# Patient Record
Sex: Female | Born: 1965 | Race: Black or African American | Hispanic: No | Marital: Single | Smoking: Never smoker
Health system: Southern US, Community
[De-identification: ages and names within clinical notes are randomized; demographics above are authoritative.]

## PROBLEM LIST (undated history)

## (undated) DIAGNOSIS — E611 Iron deficiency: Secondary | ICD-10-CM

## (undated) DIAGNOSIS — E119 Type 2 diabetes mellitus without complications: Secondary | ICD-10-CM

## (undated) DIAGNOSIS — R319 Hematuria, unspecified: Secondary | ICD-10-CM

## (undated) DIAGNOSIS — I10 Essential (primary) hypertension: Secondary | ICD-10-CM

## (undated) DIAGNOSIS — E785 Hyperlipidemia, unspecified: Secondary | ICD-10-CM

## (undated) HISTORY — DX: Hematuria, unspecified: R31.9

## (undated) HISTORY — PX: REDUCTION MAMMAPLASTY: SUR839

## (undated) HISTORY — DX: Essential (primary) hypertension: I10

## (undated) HISTORY — DX: Iron deficiency: E61.1

## (undated) HISTORY — DX: Hyperlipidemia, unspecified: E78.5

## (undated) HISTORY — DX: Type 2 diabetes mellitus without complications: E11.9

---

## 1998-03-18 ENCOUNTER — Emergency Department (HOSPITAL_COMMUNITY): Admission: EM | Admit: 1998-03-18 | Discharge: 1998-03-18 | Payer: Self-pay

## 2000-12-15 ENCOUNTER — Ambulatory Visit (HOSPITAL_COMMUNITY): Admission: RE | Admit: 2000-12-15 | Discharge: 2000-12-15 | Payer: Self-pay | Admitting: Family Medicine

## 2000-12-15 ENCOUNTER — Encounter: Payer: Self-pay | Admitting: Family Medicine

## 2001-06-30 ENCOUNTER — Other Ambulatory Visit: Admission: RE | Admit: 2001-06-30 | Discharge: 2001-06-30 | Payer: Self-pay | Admitting: Obstetrics and Gynecology

## 2002-03-15 ENCOUNTER — Ambulatory Visit (HOSPITAL_BASED_OUTPATIENT_CLINIC_OR_DEPARTMENT_OTHER): Admission: RE | Admit: 2002-03-15 | Discharge: 2002-03-16 | Payer: Self-pay | Admitting: Specialist

## 2002-03-15 ENCOUNTER — Encounter (INDEPENDENT_AMBULATORY_CARE_PROVIDER_SITE_OTHER): Payer: Self-pay | Admitting: Specialist

## 2002-05-21 ENCOUNTER — Ambulatory Visit: Admission: RE | Admit: 2002-05-21 | Discharge: 2002-05-21 | Payer: Self-pay | Admitting: Family Medicine

## 2005-04-26 ENCOUNTER — Encounter: Admission: RE | Admit: 2005-04-26 | Discharge: 2005-04-26 | Payer: Self-pay | Admitting: Obstetrics and Gynecology

## 2005-05-07 ENCOUNTER — Encounter: Admission: RE | Admit: 2005-05-07 | Discharge: 2005-05-07 | Payer: Self-pay | Admitting: Obstetrics and Gynecology

## 2006-04-29 ENCOUNTER — Encounter: Admission: RE | Admit: 2006-04-29 | Discharge: 2006-04-29 | Payer: Self-pay | Admitting: Family Medicine

## 2006-05-07 ENCOUNTER — Emergency Department (HOSPITAL_COMMUNITY): Admission: EM | Admit: 2006-05-07 | Discharge: 2006-05-07 | Payer: Self-pay | Admitting: Emergency Medicine

## 2007-02-26 ENCOUNTER — Emergency Department (HOSPITAL_COMMUNITY): Admission: EM | Admit: 2007-02-26 | Discharge: 2007-02-27 | Payer: Self-pay | Admitting: *Deleted

## 2007-05-04 ENCOUNTER — Encounter: Admission: RE | Admit: 2007-05-04 | Discharge: 2007-05-04 | Payer: Self-pay | Admitting: Family Medicine

## 2007-09-21 ENCOUNTER — Emergency Department (HOSPITAL_COMMUNITY): Admission: EM | Admit: 2007-09-21 | Discharge: 2007-09-22 | Payer: Self-pay | Admitting: Emergency Medicine

## 2007-09-22 ENCOUNTER — Ambulatory Visit: Payer: Self-pay | Admitting: Surgery

## 2007-09-22 ENCOUNTER — Ambulatory Visit (HOSPITAL_COMMUNITY): Admission: RE | Admit: 2007-09-22 | Discharge: 2007-09-22 | Payer: Self-pay | Admitting: Emergency Medicine

## 2007-09-22 ENCOUNTER — Encounter (INDEPENDENT_AMBULATORY_CARE_PROVIDER_SITE_OTHER): Payer: Self-pay | Admitting: Emergency Medicine

## 2008-05-09 ENCOUNTER — Encounter: Admission: RE | Admit: 2008-05-09 | Discharge: 2008-05-09 | Payer: Self-pay | Admitting: Family Medicine

## 2009-03-09 ENCOUNTER — Emergency Department (HOSPITAL_COMMUNITY): Admission: EM | Admit: 2009-03-09 | Discharge: 2009-03-09 | Payer: Self-pay | Admitting: Emergency Medicine

## 2009-05-10 ENCOUNTER — Encounter: Admission: RE | Admit: 2009-05-10 | Discharge: 2009-05-10 | Payer: Self-pay | Admitting: Family Medicine

## 2010-05-11 ENCOUNTER — Encounter: Admission: RE | Admit: 2010-05-11 | Discharge: 2010-05-11 | Payer: Self-pay | Admitting: Family Medicine

## 2010-09-30 ENCOUNTER — Encounter: Payer: Self-pay | Admitting: Obstetrics and Gynecology

## 2011-01-25 NOTE — Op Note (Signed)
[facility]  Patient:    Karen Buckley, FELKER Visit Number: [id number] MRN: [id number]          Service Type: DSU Location: [facility] Attending Physician:  Gustavus Messing Dictated by:   Yaakov Guthrie. Shon Hough, M.D. Admit Date:  03/15/2002 Discharge Date: 03/16/2002                             Operative Report  PREOPERATIVE DIAGNOSIS:  POSTOPERATIVE DIAGNOSIS:  PROCEDURE:  SURGEON:  Earvin Hansen L. Shon Hough, M.D.  ASSISTANT:  Alethia Berthold.  ANESTHESIA:  General.  INDICATION:  [age]-year-old lady with severe macromastia, back and shoulder pain secondary to large pendulous breasts, increased pitting edema as well as intertriginous changes in the right and left inframammary fold regions.  She has tried other medications including anti-inflammatory agents with no avail, as well as special bras.  She is a DDD-E bra and is now being prepared for bilateral reductions using the inferior pedicle technique.  DESCRIPTION OF PROCEDURE:  The patient was sat up and drawn for the inferior pedicle reduction mammoplasties, remarking the nipple-areolar complexes 20 cm from the suprasternal notch, then under general anesthesia, intubation was done without problems.  Prep was done to the chest and breast areas in routine fashion using Betadine soaping solution and walled off with sterile towels and draped so as to make a sterile field.  The patient also demonstrated increased accessory breast tissue, located predominantly in the upper axillary regions as well as the latissimus dorsi regions and serratus anterior.  Xylocaine 0.25% with epinephrine was injected locally for vasoconstriction, a total of 150 cc per side.  The wounds were scored with #15 blades, then the skin over the inferior pedicles de-epithelialized with #20 blades and medial and lateral fatty dermal pedicles were incised down to underlying fascia.  Out laterally, more excessive breast tissue  was removed and then liposuction was then fashioned to improve and remove accessory breast tissue in the upper axillary regions, right and left latissimus dorsi and serratus anterior areas.  After proper hemostasis and after the new keyholes were debulked, the flaps were transposed and stayed with 3-0 Prolene.  Subcutaneous tissue closure was done with 3-0 Monocryl x2 layers, then running subcuticular stitches of 3-0 Monocryl and 5-0 Monocryl throughout the inverted Ts.  The wounds were drained with #10 Blake drains, fully fluted, placed in the depths of the wounds and brought out through the lateral-most portions of the incisions and secured with 3-0 Prolene.  The wounds were cleansed, half-inch Steri-Strips and soft dressings applied to areas with Xeroform, 4 x 4s, ABDs and Hypafix tape.  She withstood the procedures very well and was taken to recovery in excellent condition.  ESTIMATED BLOOD LOSS:  Less than 150 cc.  COMPLICATIONS:  None. Dictated by:   Yaakov Guthrie. Shon Hough, M.D. Attending Physician:  Gustavus Messing DD:  03/15/02 TD:  03/17/02 Job: [id number]

## 2011-04-01 ENCOUNTER — Other Ambulatory Visit: Payer: Self-pay | Admitting: Obstetrics and Gynecology

## 2011-04-01 DIAGNOSIS — Z1231 Encounter for screening mammogram for malignant neoplasm of breast: Secondary | ICD-10-CM

## 2011-05-14 ENCOUNTER — Ambulatory Visit: Payer: Self-pay

## 2011-05-15 ENCOUNTER — Ambulatory Visit
Admission: RE | Admit: 2011-05-15 | Discharge: 2011-05-15 | Disposition: A | Discharge status: Home / Self Care | Payer: BC Managed Care – PPO | Source: Ambulatory Visit | Attending: Obstetrics and Gynecology | Admitting: Obstetrics and Gynecology

## 2011-05-15 DIAGNOSIS — Z1231 Encounter for screening mammogram for malignant neoplasm of breast: Secondary | ICD-10-CM

## 2011-06-26 LAB — CBC
HCT: 31.6 — ABNORMAL LOW
MCHC: 31.7
MCV: 68.5 — ABNORMAL LOW
Platelets: 297
RDW: 16.7 — ABNORMAL HIGH

## 2011-06-26 LAB — I-STAT 8, (EC8 V) (CONVERTED LAB)
BUN: 7
Bicarbonate: 24.7 — ABNORMAL HIGH
Glucose, Bld: 146 — ABNORMAL HIGH
Hemoglobin: 11.6 — ABNORMAL LOW
Sodium: 139

## 2011-06-26 LAB — DIFFERENTIAL
Basophils Absolute: 0
Lymphs Abs: 2.4
Monocytes Absolute: 0.6

## 2011-06-26 LAB — POCT CARDIAC MARKERS
CKMB, poc: 1.5
Myoglobin, poc: 92.9

## 2011-12-23 ENCOUNTER — Ambulatory Visit
Admission: RE | Admit: 2011-12-23 | Discharge: 2011-12-23 | Disposition: A | Discharge status: Home / Self Care | Payer: BC Managed Care – PPO | Source: Ambulatory Visit | Attending: Family Medicine | Admitting: Family Medicine

## 2011-12-23 ENCOUNTER — Other Ambulatory Visit: Payer: Self-pay | Admitting: Family Medicine

## 2011-12-23 DIAGNOSIS — R609 Edema, unspecified: Secondary | ICD-10-CM

## 2012-04-08 ENCOUNTER — Other Ambulatory Visit: Payer: Self-pay | Admitting: Family Medicine

## 2012-04-08 DIAGNOSIS — Z1231 Encounter for screening mammogram for malignant neoplasm of breast: Secondary | ICD-10-CM

## 2012-05-15 ENCOUNTER — Ambulatory Visit
Admission: RE | Admit: 2012-05-15 | Discharge: 2012-05-15 | Disposition: A | Discharge status: Home / Self Care | Payer: BC Managed Care – PPO | Source: Ambulatory Visit | Attending: Family Medicine | Admitting: Family Medicine

## 2012-05-15 DIAGNOSIS — Z1231 Encounter for screening mammogram for malignant neoplasm of breast: Secondary | ICD-10-CM

## 2013-04-21 ENCOUNTER — Other Ambulatory Visit: Payer: Self-pay

## 2013-04-21 DIAGNOSIS — Z1231 Encounter for screening mammogram for malignant neoplasm of breast: Secondary | ICD-10-CM

## 2013-04-21 DIAGNOSIS — Z9889 Other specified postprocedural states: Secondary | ICD-10-CM

## 2013-05-17 ENCOUNTER — Ambulatory Visit
Admission: RE | Admit: 2013-05-17 | Discharge: 2013-05-17 | Disposition: A | Discharge status: Home / Self Care | Payer: BC Managed Care – PPO | Source: Ambulatory Visit

## 2013-05-17 DIAGNOSIS — Z1231 Encounter for screening mammogram for malignant neoplasm of breast: Secondary | ICD-10-CM

## 2013-05-17 DIAGNOSIS — Z9889 Other specified postprocedural states: Secondary | ICD-10-CM

## 2013-05-18 ENCOUNTER — Other Ambulatory Visit: Payer: Self-pay | Admitting: Family Medicine

## 2013-05-18 DIAGNOSIS — N63 Unspecified lump in unspecified breast: Secondary | ICD-10-CM

## 2013-06-24 ENCOUNTER — Ambulatory Visit
Admission: RE | Admit: 2013-06-24 | Discharge: 2013-06-24 | Disposition: A | Discharge status: Home / Self Care | Payer: BC Managed Care – PPO | Source: Ambulatory Visit | Attending: Family Medicine | Admitting: Family Medicine

## 2013-06-24 DIAGNOSIS — N63 Unspecified lump in unspecified breast: Secondary | ICD-10-CM

## 2014-01-03 ENCOUNTER — Other Ambulatory Visit: Payer: Self-pay | Admitting: Family Medicine

## 2014-01-03 DIAGNOSIS — N63 Unspecified lump in unspecified breast: Secondary | ICD-10-CM

## 2014-01-03 DIAGNOSIS — N644 Mastodynia: Secondary | ICD-10-CM

## 2014-01-10 ENCOUNTER — Encounter (INDEPENDENT_AMBULATORY_CARE_PROVIDER_SITE_OTHER): Payer: Self-pay

## 2014-01-10 ENCOUNTER — Ambulatory Visit
Admission: RE | Admit: 2014-01-10 | Discharge: 2014-01-10 | Disposition: A | Discharge status: Home / Self Care | Payer: BC Managed Care – PPO | Source: Ambulatory Visit | Attending: Family Medicine | Admitting: Family Medicine

## 2014-01-10 DIAGNOSIS — N644 Mastodynia: Secondary | ICD-10-CM

## 2014-01-10 DIAGNOSIS — N63 Unspecified lump in unspecified breast: Secondary | ICD-10-CM

## 2014-05-24 ENCOUNTER — Other Ambulatory Visit: Payer: Self-pay

## 2014-05-24 DIAGNOSIS — Z9889 Other specified postprocedural states: Secondary | ICD-10-CM

## 2014-05-24 DIAGNOSIS — Z1231 Encounter for screening mammogram for malignant neoplasm of breast: Secondary | ICD-10-CM

## 2014-06-27 ENCOUNTER — Ambulatory Visit
Admission: RE | Admit: 2014-06-27 | Discharge: 2014-06-27 | Disposition: A | Discharge status: Home / Self Care | Payer: BC Managed Care – PPO | Source: Ambulatory Visit

## 2014-06-27 ENCOUNTER — Encounter (INDEPENDENT_AMBULATORY_CARE_PROVIDER_SITE_OTHER): Payer: Self-pay

## 2014-06-27 DIAGNOSIS — Z9889 Other specified postprocedural states: Secondary | ICD-10-CM

## 2014-06-27 DIAGNOSIS — Z1231 Encounter for screening mammogram for malignant neoplasm of breast: Secondary | ICD-10-CM

## 2014-09-07 ENCOUNTER — Encounter: Payer: Self-pay | Admitting: *Deleted

## 2015-01-11 ENCOUNTER — Ambulatory Visit
Admission: RE | Admit: 2015-01-11 | Discharge: 2015-01-11 | Disposition: A | Discharge status: Home / Self Care | Payer: BC Managed Care – PPO | Source: Ambulatory Visit | Attending: Family Medicine | Admitting: Family Medicine

## 2015-01-11 ENCOUNTER — Other Ambulatory Visit: Payer: Self-pay | Admitting: Family Medicine

## 2015-01-11 DIAGNOSIS — S90121A Contusion of right lesser toe(s) without damage to nail, initial encounter: Secondary | ICD-10-CM

## 2015-05-16 ENCOUNTER — Other Ambulatory Visit: Payer: Self-pay

## 2015-05-16 DIAGNOSIS — Z1231 Encounter for screening mammogram for malignant neoplasm of breast: Secondary | ICD-10-CM

## 2015-07-05 ENCOUNTER — Ambulatory Visit: Payer: BC Managed Care – PPO

## 2015-07-19 ENCOUNTER — Ambulatory Visit: Payer: BC Managed Care – PPO

## 2015-08-01 ENCOUNTER — Ambulatory Visit: Payer: BC Managed Care – PPO

## 2015-08-08 ENCOUNTER — Ambulatory Visit
Admission: RE | Admit: 2015-08-08 | Discharge: 2015-08-08 | Disposition: A | Discharge status: Home / Self Care | Payer: BC Managed Care – PPO | Source: Ambulatory Visit

## 2015-08-08 DIAGNOSIS — Z1231 Encounter for screening mammogram for malignant neoplasm of breast: Secondary | ICD-10-CM

## 2016-04-08 ENCOUNTER — Ambulatory Visit
Admission: RE | Admit: 2016-04-08 | Discharge: 2016-04-08 | Disposition: A | Discharge status: Home / Self Care | Payer: BC Managed Care – PPO | Source: Ambulatory Visit | Attending: Family Medicine | Admitting: Family Medicine

## 2016-04-08 ENCOUNTER — Other Ambulatory Visit: Payer: Self-pay | Admitting: Family Medicine

## 2016-04-08 DIAGNOSIS — R059 Cough, unspecified: Secondary | ICD-10-CM

## 2016-04-08 DIAGNOSIS — R05 Cough: Secondary | ICD-10-CM

## 2016-07-01 ENCOUNTER — Other Ambulatory Visit: Payer: Self-pay | Admitting: Obstetrics and Gynecology

## 2016-07-01 DIAGNOSIS — Z1231 Encounter for screening mammogram for malignant neoplasm of breast: Secondary | ICD-10-CM

## 2016-07-09 ENCOUNTER — Other Ambulatory Visit: Payer: Self-pay | Admitting: Family Medicine

## 2016-07-09 DIAGNOSIS — N060 Isolated proteinuria with minor glomerular abnormality: Secondary | ICD-10-CM

## 2016-07-17 ENCOUNTER — Ambulatory Visit
Admission: RE | Admit: 2016-07-17 | Discharge: 2016-07-17 | Disposition: A | Discharge status: Home / Self Care | Payer: BC Managed Care – PPO | Source: Ambulatory Visit | Attending: Family Medicine | Admitting: Family Medicine

## 2016-07-17 DIAGNOSIS — N060 Isolated proteinuria with minor glomerular abnormality: Secondary | ICD-10-CM

## 2016-07-18 ENCOUNTER — Other Ambulatory Visit: Payer: BC Managed Care – PPO

## 2016-08-19 ENCOUNTER — Ambulatory Visit
Admission: RE | Admit: 2016-08-19 | Discharge: 2016-08-19 | Disposition: A | Discharge status: Home / Self Care | Payer: BC Managed Care – PPO | Source: Ambulatory Visit | Attending: Obstetrics and Gynecology | Admitting: Obstetrics and Gynecology

## 2016-08-19 DIAGNOSIS — Z1231 Encounter for screening mammogram for malignant neoplasm of breast: Secondary | ICD-10-CM

## 2016-08-20 ENCOUNTER — Other Ambulatory Visit: Payer: Self-pay | Admitting: Obstetrics and Gynecology

## 2016-08-20 DIAGNOSIS — R928 Other abnormal and inconclusive findings on diagnostic imaging of breast: Secondary | ICD-10-CM

## 2016-08-28 ENCOUNTER — Other Ambulatory Visit: Payer: BC Managed Care – PPO

## 2016-08-29 ENCOUNTER — Ambulatory Visit
Admission: RE | Admit: 2016-08-29 | Discharge: 2016-08-29 | Disposition: A | Discharge status: Home / Self Care | Payer: BC Managed Care – PPO | Source: Ambulatory Visit | Attending: Obstetrics and Gynecology | Admitting: Obstetrics and Gynecology

## 2016-08-29 DIAGNOSIS — R928 Other abnormal and inconclusive findings on diagnostic imaging of breast: Secondary | ICD-10-CM

## 2017-02-17 ENCOUNTER — Ambulatory Visit
Admission: RE | Admit: 2017-02-17 | Discharge: 2017-02-17 | Disposition: A | Discharge status: Home / Self Care | Payer: BC Managed Care – PPO | Source: Ambulatory Visit | Attending: Family Medicine | Admitting: Family Medicine

## 2017-02-17 ENCOUNTER — Other Ambulatory Visit: Payer: Self-pay | Admitting: Family Medicine

## 2017-02-17 DIAGNOSIS — S60221A Contusion of right hand, initial encounter: Secondary | ICD-10-CM

## 2017-04-03 ENCOUNTER — Other Ambulatory Visit: Payer: Self-pay | Admitting: Nephrology

## 2017-04-03 DIAGNOSIS — R319 Hematuria, unspecified: Secondary | ICD-10-CM

## 2017-04-07 ENCOUNTER — Other Ambulatory Visit: Payer: BC Managed Care – PPO

## 2017-04-08 ENCOUNTER — Ambulatory Visit
Admission: RE | Admit: 2017-04-08 | Discharge: 2017-04-08 | Disposition: A | Discharge status: Home / Self Care | Payer: BC Managed Care – PPO | Source: Ambulatory Visit | Attending: Nephrology | Admitting: Nephrology

## 2017-04-08 DIAGNOSIS — R319 Hematuria, unspecified: Secondary | ICD-10-CM

## 2017-08-06 ENCOUNTER — Other Ambulatory Visit: Payer: Self-pay | Admitting: Obstetrics and Gynecology

## 2017-08-06 DIAGNOSIS — Z1231 Encounter for screening mammogram for malignant neoplasm of breast: Secondary | ICD-10-CM

## 2017-09-04 ENCOUNTER — Ambulatory Visit: Payer: BC Managed Care – PPO

## 2017-09-05 ENCOUNTER — Ambulatory Visit
Admission: RE | Admit: 2017-09-05 | Discharge: 2017-09-05 | Disposition: A | Discharge status: Home / Self Care | Payer: BC Managed Care – PPO | Source: Ambulatory Visit | Attending: Obstetrics and Gynecology | Admitting: Obstetrics and Gynecology

## 2017-09-05 DIAGNOSIS — Z1231 Encounter for screening mammogram for malignant neoplasm of breast: Secondary | ICD-10-CM

## 2018-07-15 IMAGING — US US RENAL
1 series · 14 of 25 positions shown · non-contrast
Comparison: CT abdomen and pelvis August 08, 2014.

CLINICAL DATA: Proteinuria

EXAM:
RENAL / URINARY TRACT ULTRASOUND COMPLETE

[Series 1: us renal · 0.28mm/px · 14 of 48 slices shown]
[im 1/48]
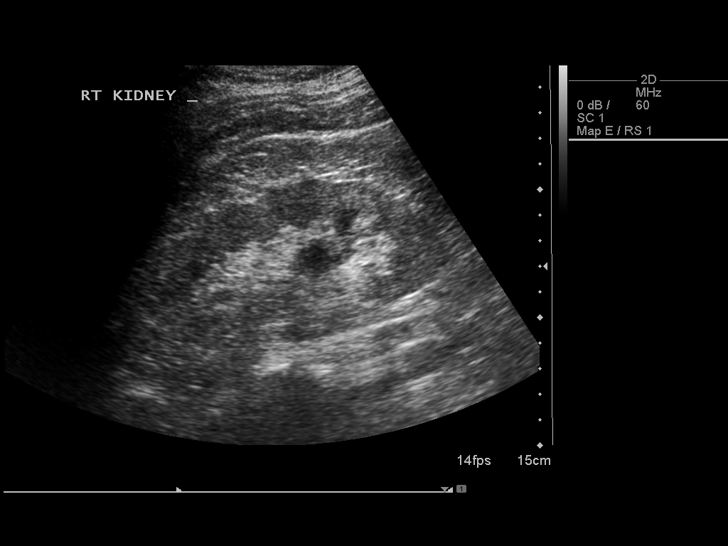
[im 4/48]
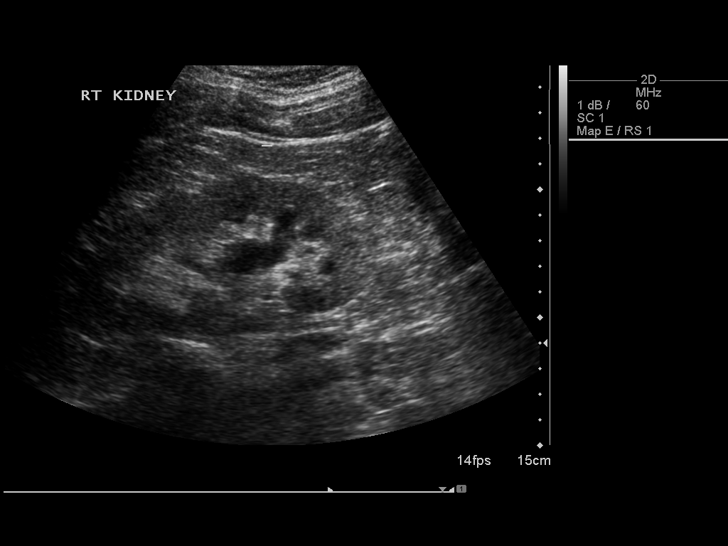
[im 8/48]
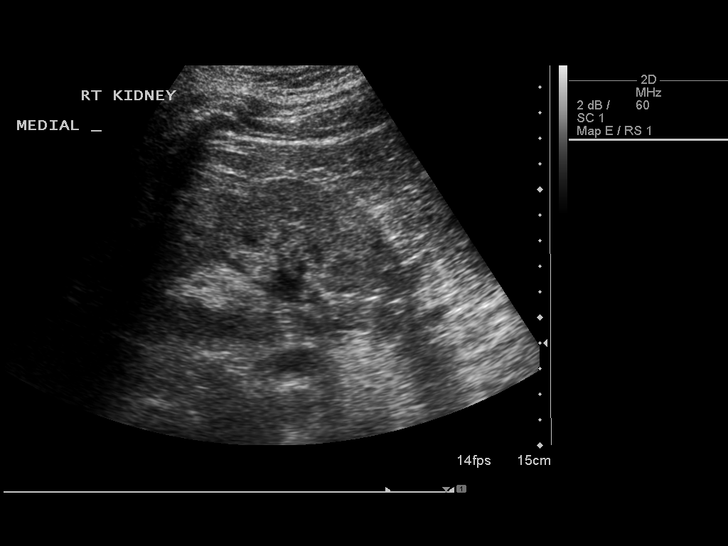
[im 12/48]
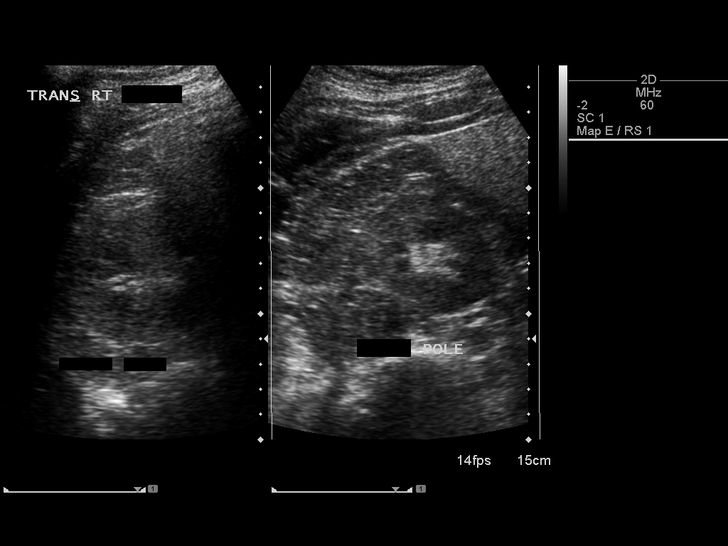
[im 16/48]
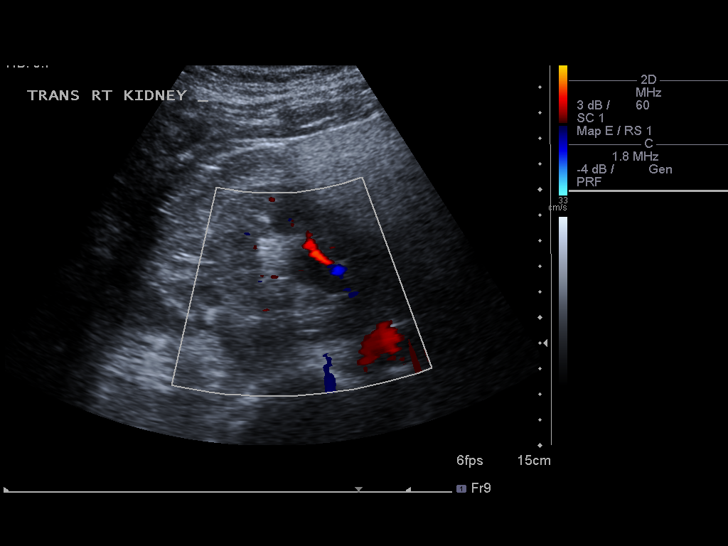
[im 18/48]
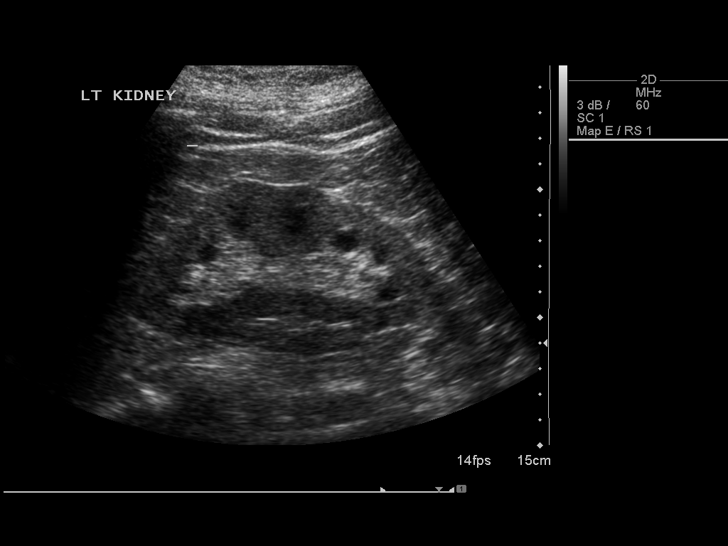
[im 22/48]
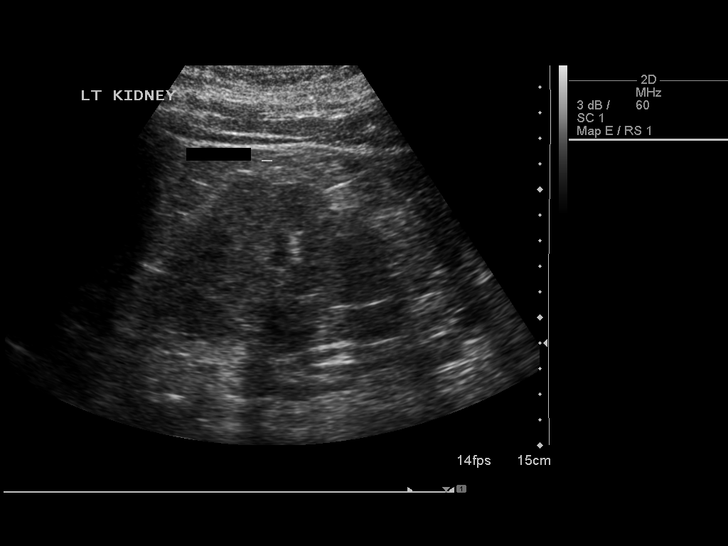
[im 26/48]
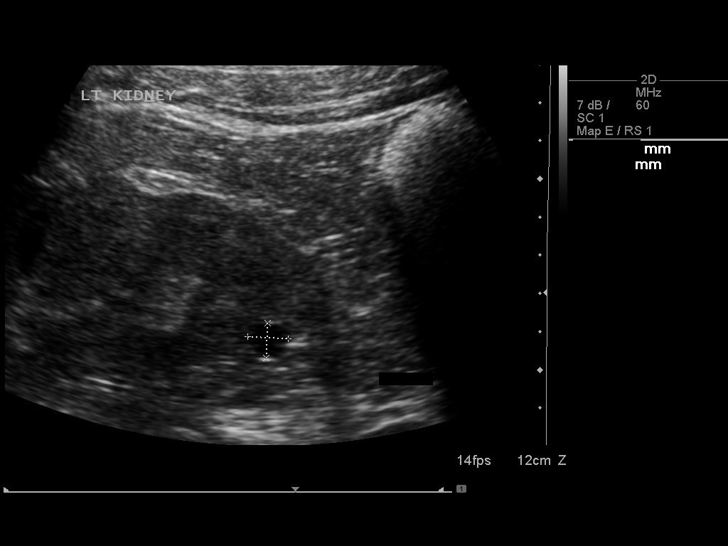
[im 30/48]
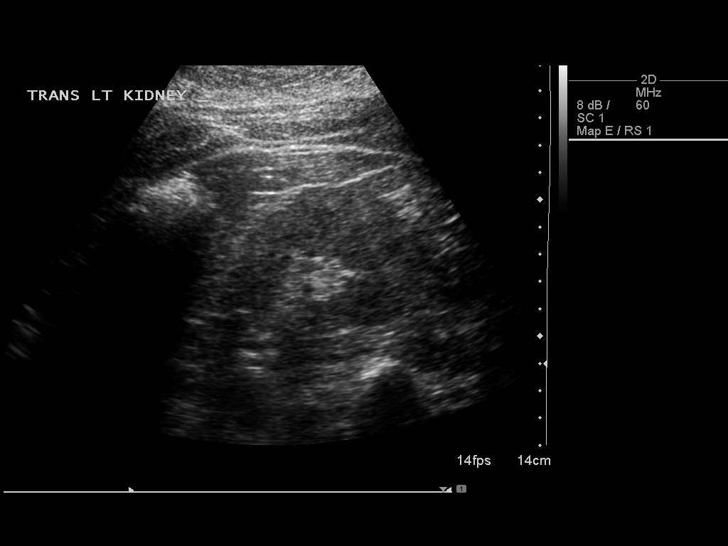
[im 32/48]
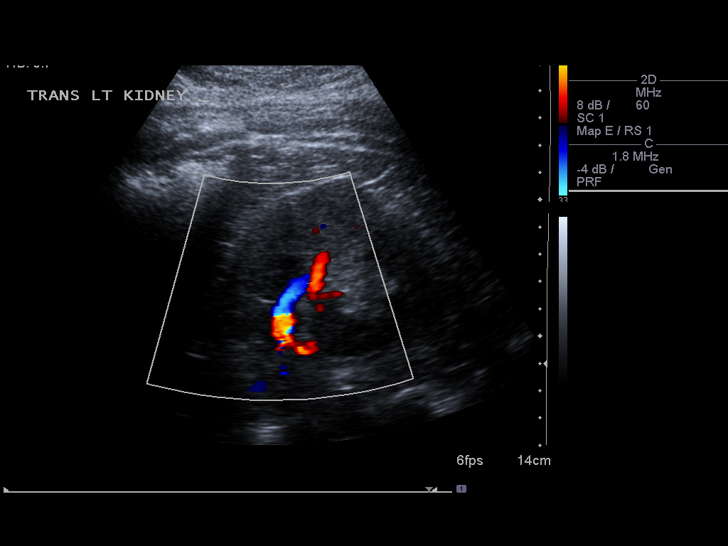
[im 36/48]
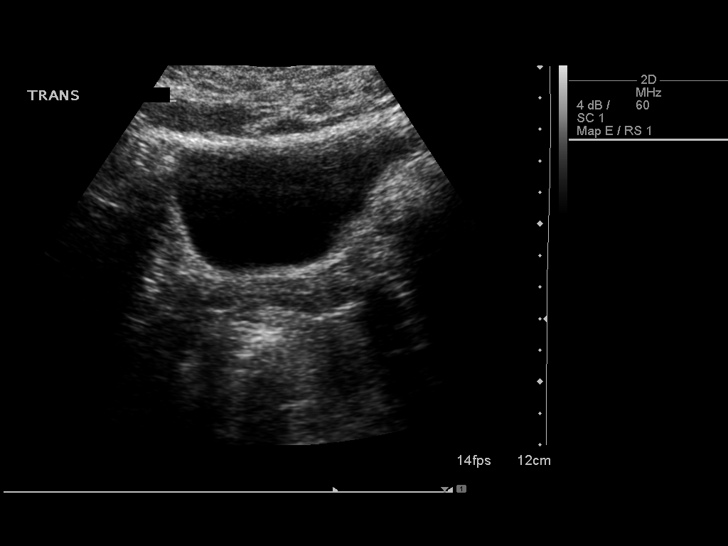
[im 40/48]
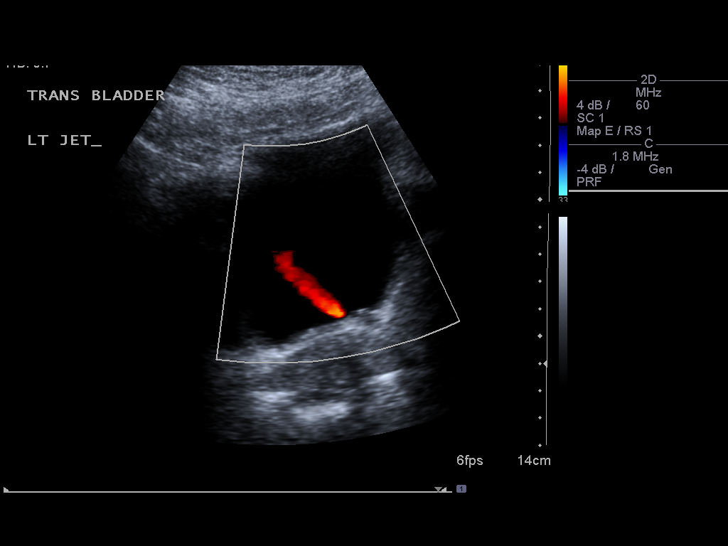
[im 44/48]
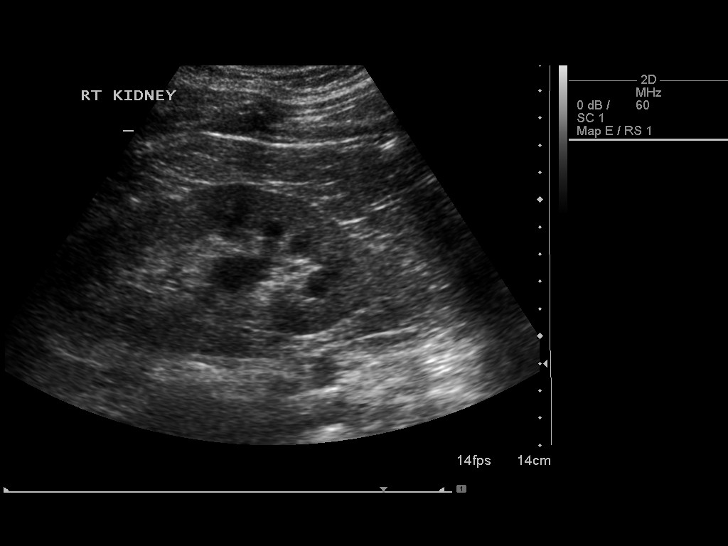
[im 48/48]
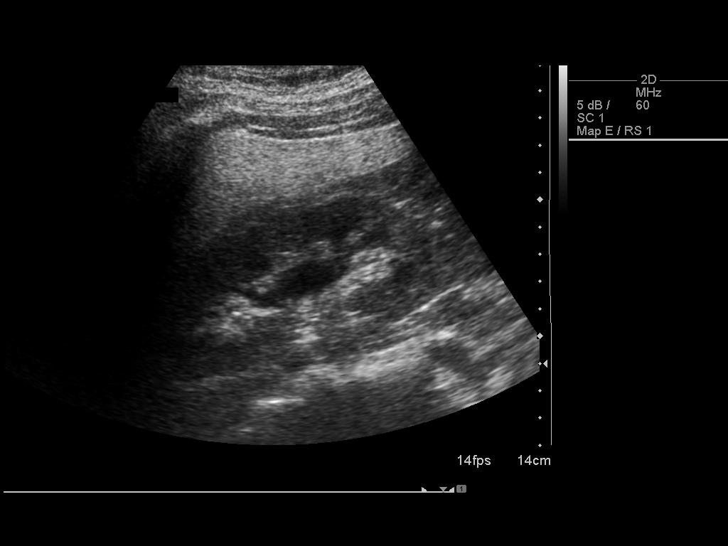

[14 of 25 positions shown; findings below may reference images not displayed]

FINDINGS: Right Kidney:

Length: 12.7 cm. Echogenicity and renal cortical thickness are
within normal limits. No mass or perinephric fluid visualized. There
is mild pelvicaliectasis on the right which persisted after voiding.
No sonographically demonstrable calculus or ureterectasis evident.

Left Kidney:

Length: 12.3 cm. Echogenicity and renal cortical thickness are
within normal limits. No perinephric fluid or hydronephrosis
visualized. There is a cyst in the lower pole left kidney measuring
1.1 x 0.9 x 0.8 cm. No sonographically demonstrable calculus or
ureterectasis.

Bladder:

Appears normal for degree of bladder distention. Flow is seen from
each distal ureter in the urinary bladder.
IMPRESSION: Mild pelvicaliectasis on the right which persists after voiding.
Note that flow from the distal right ureter is seen in the bladder.
Small left renal cyst. Study otherwise unremarkable.

## 2018-09-10 ENCOUNTER — Other Ambulatory Visit: Payer: Self-pay | Admitting: Obstetrics and Gynecology

## 2018-09-10 DIAGNOSIS — Z1231 Encounter for screening mammogram for malignant neoplasm of breast: Secondary | ICD-10-CM

## 2018-10-09 ENCOUNTER — Ambulatory Visit
Admission: RE | Admit: 2018-10-09 | Discharge: 2018-10-09 | Disposition: A | Discharge status: Home / Self Care | Payer: BC Managed Care – PPO | Source: Ambulatory Visit | Attending: Obstetrics and Gynecology | Admitting: Obstetrics and Gynecology

## 2018-10-09 ENCOUNTER — Other Ambulatory Visit: Payer: Self-pay | Admitting: Obstetrics and Gynecology

## 2018-10-09 DIAGNOSIS — Z1231 Encounter for screening mammogram for malignant neoplasm of breast: Secondary | ICD-10-CM

## 2019-10-27 ENCOUNTER — Other Ambulatory Visit: Payer: Self-pay | Admitting: Family Medicine

## 2019-10-27 DIAGNOSIS — Z1231 Encounter for screening mammogram for malignant neoplasm of breast: Secondary | ICD-10-CM

## 2019-11-18 ENCOUNTER — Ambulatory Visit: Payer: BC Managed Care – PPO | Attending: Family

## 2019-11-18 DIAGNOSIS — Z23 Encounter for immunization: Secondary | ICD-10-CM

## 2019-11-18 NOTE — Progress Notes (Signed)
   Covid-19 Vaccination Clinic  Name:  Karen Buckley    MRN: [id number] DOB: Jan 17, 1966  11/18/2019  [name] was observed post Covid-19 immunization for 15 minutes without incident. She was provided with Vaccine Information Sheet and instruction to access the V-Safe system.   [name] was instructed to call 911 with any severe reactions post vaccine: Marland Kitchen Difficulty breathing  . Swelling of face and throat  . A fast heartbeat  . A bad rash all over body  . Dizziness and weakness   Immunizations Administered    Name Date Dose VIS Date Route   Moderna COVID-19 Vaccine 11/18/2019 11:47 AM 0.5 mL 08/10/2019 Intramuscular   Manufacturer: Moderna   Lot: [id number]   NDC: 61443-154-00

## 2019-12-21 ENCOUNTER — Ambulatory Visit: Payer: BC Managed Care – PPO | Attending: Family

## 2019-12-21 DIAGNOSIS — Z23 Encounter for immunization: Secondary | ICD-10-CM

## 2019-12-21 NOTE — Progress Notes (Signed)
   Covid-19 Vaccination Clinic  Name:  Karen Buckley    MRN: [id number] DOB: May 06, 1966  12/21/2019  [name] was observed post Covid-19 immunization for 15 minutes without incident. She was provided with Vaccine Information Sheet and instruction to access the V-Safe system.   [name] was instructed to call 911 with any severe reactions post vaccine: Marland Kitchen Difficulty breathing  . Swelling of face and throat  . A fast heartbeat  . A bad rash all over body  . Dizziness and weakness   Immunizations Administered    Name Date Dose VIS Date Route   Moderna COVID-19 Vaccine 12/21/2019  1:45 PM 0.5 mL 08/10/2019 Intramuscular   Manufacturer: Moderna   Lot: [id number]   NDC: 58346-219-47

## 2020-01-07 ENCOUNTER — Other Ambulatory Visit: Payer: Self-pay | Admitting: Family Medicine

## 2020-01-07 ENCOUNTER — Ambulatory Visit: Payer: BC Managed Care – PPO

## 2020-01-07 DIAGNOSIS — M5412 Radiculopathy, cervical region: Secondary | ICD-10-CM

## 2020-01-27 ENCOUNTER — Other Ambulatory Visit: Payer: BC Managed Care – PPO

## 2020-02-08 ENCOUNTER — Ambulatory Visit
Admission: RE | Admit: 2020-02-08 | Discharge: 2020-02-08 | Disposition: A | Discharge status: Home / Self Care | Payer: BC Managed Care – PPO | Source: Ambulatory Visit | Attending: Family Medicine | Admitting: Family Medicine

## 2020-02-08 ENCOUNTER — Other Ambulatory Visit: Payer: Self-pay

## 2020-02-08 ENCOUNTER — Other Ambulatory Visit: Payer: Self-pay | Admitting: Family Medicine

## 2020-02-08 DIAGNOSIS — Z1231 Encounter for screening mammogram for malignant neoplasm of breast: Secondary | ICD-10-CM

## 2020-02-09 ENCOUNTER — Other Ambulatory Visit: Payer: BC Managed Care – PPO

## 2020-02-11 ENCOUNTER — Other Ambulatory Visit: Payer: Self-pay

## 2020-02-11 ENCOUNTER — Other Ambulatory Visit: Payer: Self-pay | Admitting: Family Medicine

## 2020-02-11 ENCOUNTER — Ambulatory Visit
Admission: RE | Admit: 2020-02-11 | Discharge: 2020-02-11 | Disposition: A | Discharge status: Home / Self Care | Payer: BC Managed Care – PPO | Source: Ambulatory Visit | Attending: Family Medicine | Admitting: Family Medicine

## 2020-02-11 DIAGNOSIS — M5412 Radiculopathy, cervical region: Secondary | ICD-10-CM

## 2020-07-06 ENCOUNTER — Ambulatory Visit: Payer: BC Managed Care – PPO | Attending: Internal Medicine

## 2020-07-06 DIAGNOSIS — Z23 Encounter for immunization: Secondary | ICD-10-CM

## 2020-07-06 NOTE — Progress Notes (Signed)
° °  Covid-19 Vaccination Clinic  Name:  Joselle Deeds    MRN: [id number] DOB: 1966-06-02  07/06/2020  Ms. Beaulieu was observed post Covid-19 immunization for 15 minutes without incident. She was provided with Vaccine Information Sheet and instruction to access the V-Safe system.   Ms. Bortle was instructed to call 911 with any severe reactions post vaccine:  Difficulty breathing   Swelling of face and throat   A fast heartbeat   A bad rash all over body   Dizziness and weakness

## 2021-01-29 ENCOUNTER — Other Ambulatory Visit: Payer: Self-pay | Admitting: Family Medicine

## 2021-01-29 DIAGNOSIS — Z1231 Encounter for screening mammogram for malignant neoplasm of breast: Secondary | ICD-10-CM

## 2021-02-27 ENCOUNTER — Other Ambulatory Visit: Payer: Self-pay | Admitting: Family Medicine

## 2021-02-27 DIAGNOSIS — R7989 Other specified abnormal findings of blood chemistry: Secondary | ICD-10-CM

## 2021-03-22 ENCOUNTER — Ambulatory Visit
Admission: RE | Admit: 2021-03-22 | Discharge: 2021-03-22 | Disposition: A | Discharge status: Home / Self Care | Payer: BC Managed Care – PPO | Source: Ambulatory Visit | Attending: Family Medicine | Admitting: Family Medicine

## 2021-03-22 DIAGNOSIS — R7989 Other specified abnormal findings of blood chemistry: Secondary | ICD-10-CM

## 2021-03-28 ENCOUNTER — Other Ambulatory Visit: Payer: Self-pay

## 2021-03-28 ENCOUNTER — Ambulatory Visit
Admission: RE | Admit: 2021-03-28 | Discharge: 2021-03-28 | Disposition: A | Discharge status: Home / Self Care | Payer: BC Managed Care – PPO | Source: Ambulatory Visit | Attending: Family Medicine | Admitting: Family Medicine

## 2021-03-28 DIAGNOSIS — Z1231 Encounter for screening mammogram for malignant neoplasm of breast: Secondary | ICD-10-CM

## 2021-04-09 ENCOUNTER — Other Ambulatory Visit: Payer: Self-pay | Admitting: Family Medicine

## 2021-04-09 DIAGNOSIS — K8689 Other specified diseases of pancreas: Secondary | ICD-10-CM

## 2021-05-06 ENCOUNTER — Ambulatory Visit
Admission: RE | Admit: 2021-05-06 | Discharge: 2021-05-06 | Disposition: A | Discharge status: Home / Self Care | Payer: BC Managed Care – PPO | Source: Ambulatory Visit | Attending: Family Medicine | Admitting: Family Medicine

## 2021-05-06 DIAGNOSIS — K8689 Other specified diseases of pancreas: Secondary | ICD-10-CM

## 2021-05-10 ENCOUNTER — Other Ambulatory Visit: Payer: Self-pay | Admitting: Family Medicine

## 2021-05-10 DIAGNOSIS — K8689 Other specified diseases of pancreas: Secondary | ICD-10-CM

## 2021-07-03 ENCOUNTER — Other Ambulatory Visit: Payer: BC Managed Care – PPO

## 2021-07-11 ENCOUNTER — Ambulatory Visit
Admission: RE | Admit: 2021-07-11 | Discharge: 2021-07-11 | Disposition: A | Discharge status: Home / Self Care | Payer: BC Managed Care – PPO | Source: Ambulatory Visit | Attending: Family Medicine | Admitting: Family Medicine

## 2021-07-11 ENCOUNTER — Other Ambulatory Visit: Payer: Self-pay

## 2021-07-11 DIAGNOSIS — K8689 Other specified diseases of pancreas: Secondary | ICD-10-CM

## 2021-07-11 MED ORDER — IOPAMIDOL (ISOVUE-300) INJECTION 61%
100.0000 mL | Freq: Once | INTRAVENOUS | Status: AC | PRN
  Administered 2021-07-11: 100 mL via INTRAVENOUS

## 2021-07-18 ENCOUNTER — Other Ambulatory Visit: Payer: Self-pay | Admitting: Family Medicine

## 2021-07-18 DIAGNOSIS — Q453 Other congenital malformations of pancreas and pancreatic duct: Secondary | ICD-10-CM

## 2021-08-09 ENCOUNTER — Ambulatory Visit
Admission: RE | Admit: 2021-08-09 | Discharge: 2021-08-09 | Disposition: A | Discharge status: Home / Self Care | Payer: BC Managed Care – PPO | Source: Ambulatory Visit | Attending: Family Medicine | Admitting: Family Medicine

## 2021-08-09 DIAGNOSIS — Q453 Other congenital malformations of pancreas and pancreatic duct: Secondary | ICD-10-CM

## 2021-08-09 MED ORDER — GADOBENATE DIMEGLUMINE 529 MG/ML IV SOLN
15.0000 mL | Freq: Once | INTRAVENOUS | Status: AC | PRN
  Administered 2021-08-09: 15 mL via INTRAVENOUS

## 2021-10-25 ENCOUNTER — Other Ambulatory Visit: Payer: Self-pay

## 2021-10-25 ENCOUNTER — Ambulatory Visit: Payer: BC Managed Care – PPO | Attending: Family

## 2021-10-25 DIAGNOSIS — Z23 Encounter for immunization: Secondary | ICD-10-CM

## 2021-10-25 NOTE — Progress Notes (Signed)
° °  Covid-19 Vaccination Clinic  Name:  Saidah Kempton    MRN: [id number] DOB: [birth year]  10/25/2021  Ms. Madonna was observed post Covid-19 immunization for 15 minutes without incident. She was provided with Vaccine Information Sheet and instruction to access the V-Safe system.   Ms. Revell was instructed to call 911 with any severe reactions post vaccine: Difficulty breathing  Swelling of face and throat  A fast heartbeat  A bad rash all over body  Dizziness and weakness   Immunizations Administered     Name Date Dose VIS Date Route   Moderna Covid-19 vaccine Bivalent Booster 10/25/2021  4:37 PM 0.5 mL 04/21/2021 Intramuscular   Manufacturer: Moderna   Lot: [id number]   NDC: 97989-211-94

## 2022-04-02 ENCOUNTER — Other Ambulatory Visit: Payer: Self-pay | Admitting: Family Medicine

## 2022-04-02 DIAGNOSIS — Z1231 Encounter for screening mammogram for malignant neoplasm of breast: Secondary | ICD-10-CM

## 2022-04-16 ENCOUNTER — Ambulatory Visit: Payer: BC Managed Care – PPO

## 2022-04-30 ENCOUNTER — Ambulatory Visit
Admission: RE | Admit: 2022-04-30 | Discharge: 2022-04-30 | Disposition: A | Discharge status: Home / Self Care | Payer: BC Managed Care – PPO | Source: Ambulatory Visit | Attending: Family Medicine | Admitting: Family Medicine

## 2022-04-30 DIAGNOSIS — Z1231 Encounter for screening mammogram for malignant neoplasm of breast: Secondary | ICD-10-CM

## 2023-02-24 LAB — EXTERNAL GENERIC LAB PROCEDURE: COLOGUARD: NEGATIVE

## 2023-02-24 LAB — COLOGUARD: COLOGUARD: NEGATIVE

## 2023-04-03 ENCOUNTER — Other Ambulatory Visit: Payer: Self-pay | Admitting: Family Medicine

## 2023-04-03 DIAGNOSIS — Z1231 Encounter for screening mammogram for malignant neoplasm of breast: Secondary | ICD-10-CM

## 2023-05-02 ENCOUNTER — Ambulatory Visit
Admission: RE | Admit: 2023-05-02 | Discharge: 2023-05-02 | Disposition: A | Discharge status: Home / Self Care | Payer: BC Managed Care – PPO | Source: Ambulatory Visit | Attending: Family Medicine | Admitting: Family Medicine

## 2023-05-02 DIAGNOSIS — Z1231 Encounter for screening mammogram for malignant neoplasm of breast: Secondary | ICD-10-CM

## 2023-05-26 ENCOUNTER — Other Ambulatory Visit: Payer: Self-pay | Admitting: Family Medicine

## 2023-05-26 DIAGNOSIS — K862 Cyst of pancreas: Secondary | ICD-10-CM

## 2023-07-03 ENCOUNTER — Encounter: Payer: Self-pay | Admitting: Family Medicine

## 2023-07-09 IMAGING — CT CT ABDOMEN WO/W CM
2 of 7 series · 13 of 32 positions shown, 18 images · IV contrast (APPLIED)
Comparison: August 08, 2014.

CLINICAL DATA: A 54-year-old female presents with history of
questionable dilated pancreatic duct.

EXAM:
CT ABDOMEN WITHOUT AND WITH CONTRAST
TECHNIQUE: Multidetector CT imaging of the abdomen was performed following the
standard protocol before and following the bolus administration of
intravenous contrast.
Creatinine was obtained on site at [HOSPITAL] at [HOSPITAL].
Results: Creatinine 1.0 mg/dL.
CONTRAST:  100mL 46IGR3-144 IOPAMIDOL (46IGR3-144) INJECTION 61%

[Series 3: arterial · axial · arterial · 0.74mm/px · z∈[-217,+8]mm · 6 of 106 slices shown]
[im 16/106  soft-tissue]
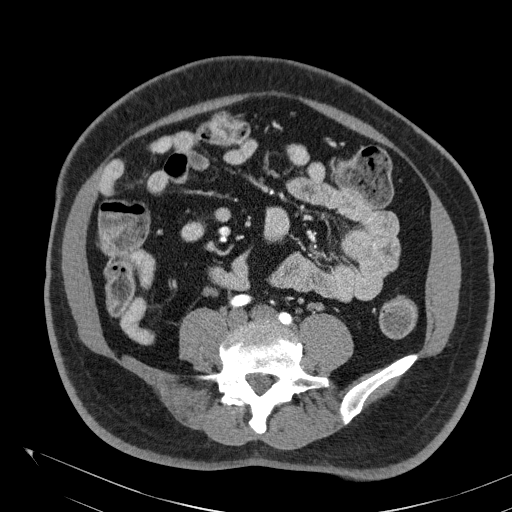
[im 31/106  soft-tissue]
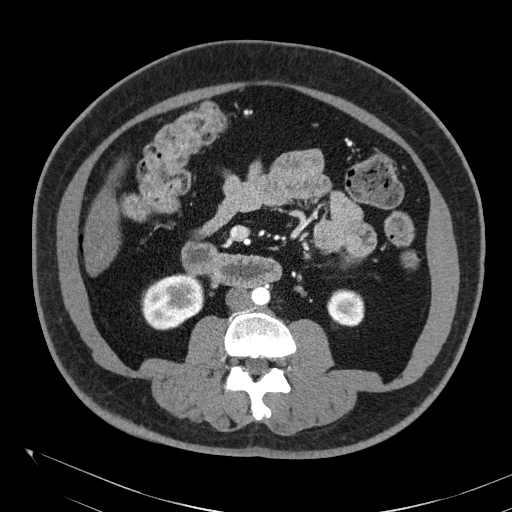
[im 46/106  soft-tissue]
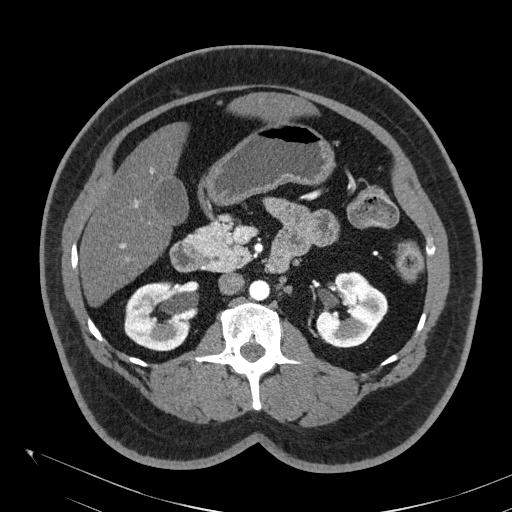
[im 61/106  soft-tissue]
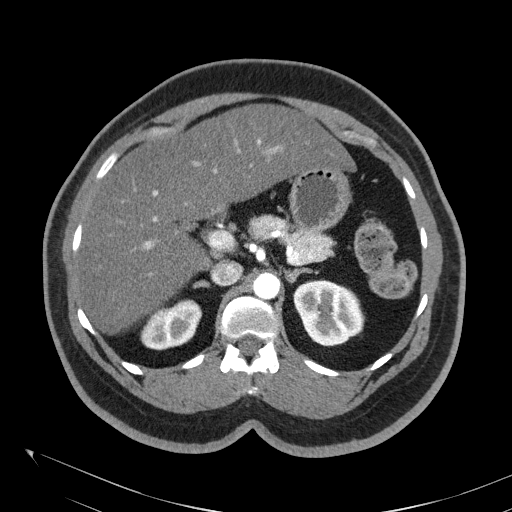
[im 76/106  soft-tissue]
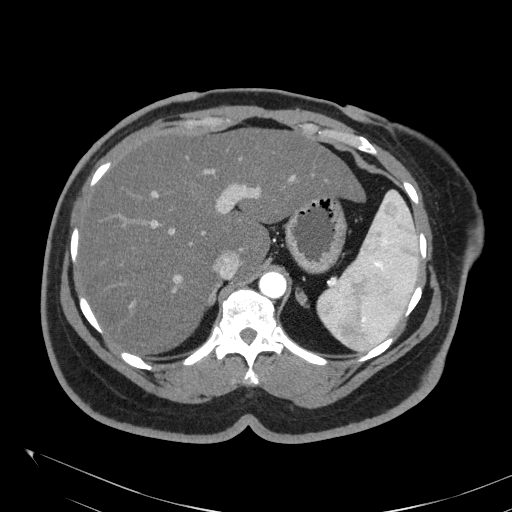
[im 91/106  soft-tissue]
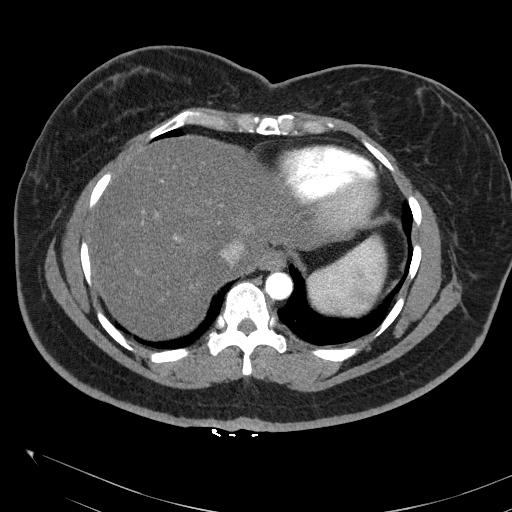

[Series 4: venous · axial · portal-venous · 0.73mm/px · z∈[-237,+18]mm · 7 of 115 slices shown, 12 images]
[im 15/115  soft-tissue]
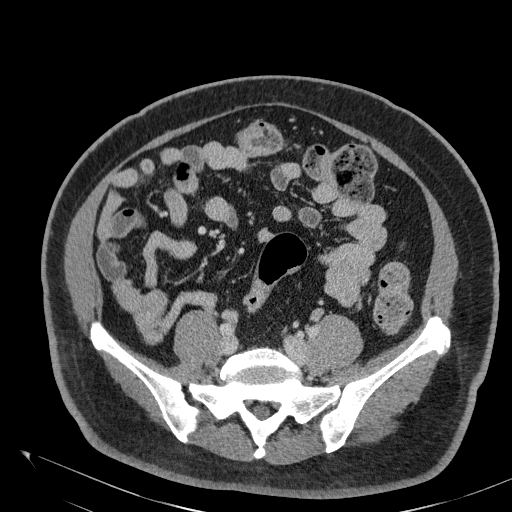
[im 15/115  bone]
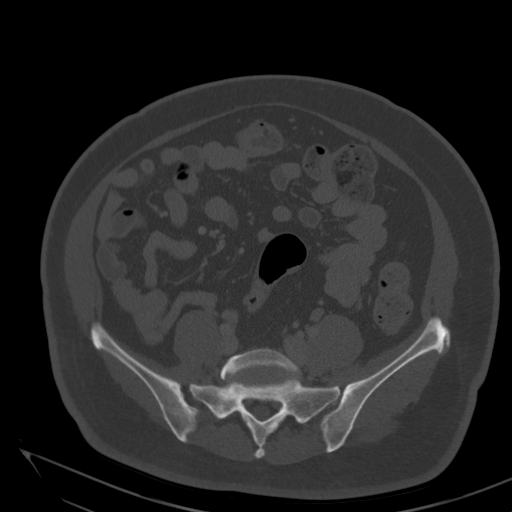
[im 29/115  soft-tissue]
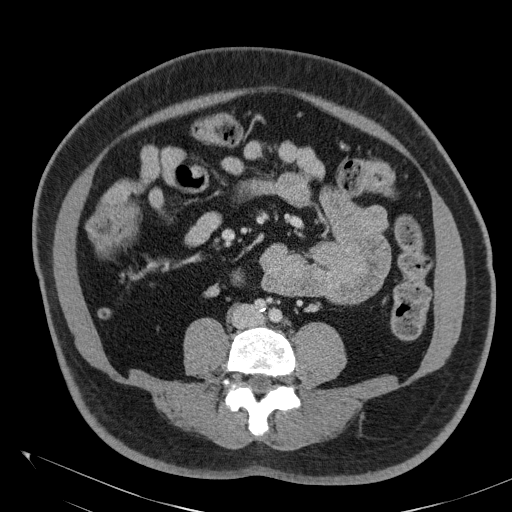
[im 43/115  soft-tissue]
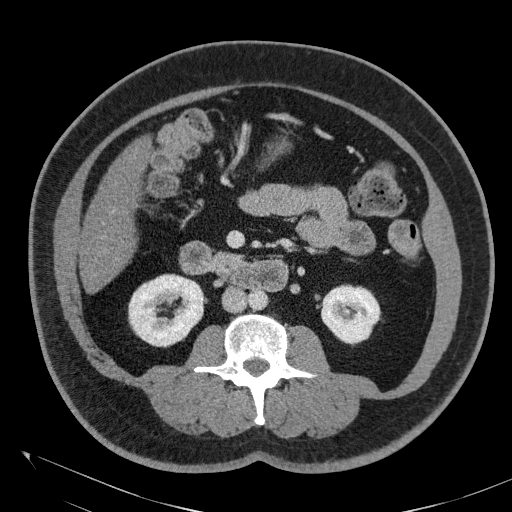
[im 58/115  soft-tissue]
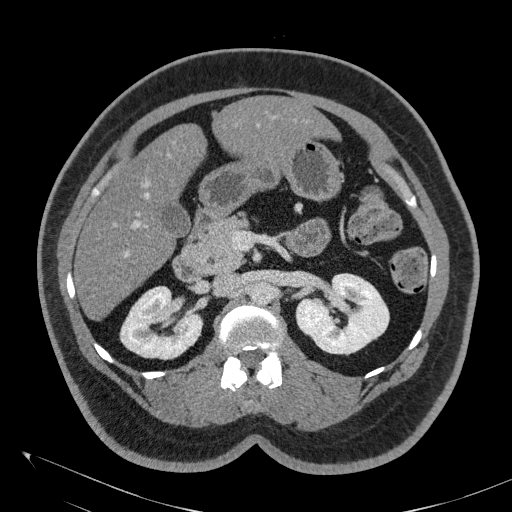
[im 58/115  lung]
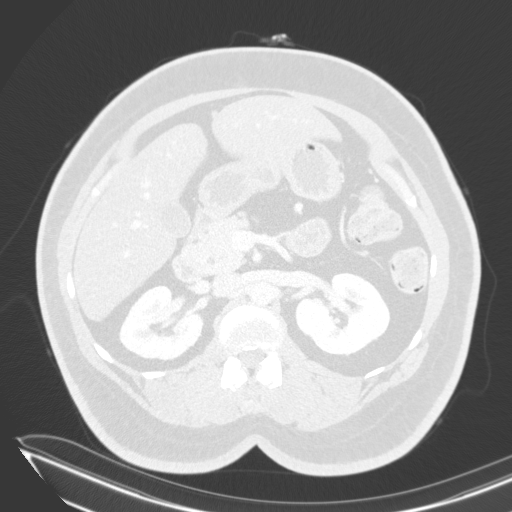
[im 72/115  soft-tissue]
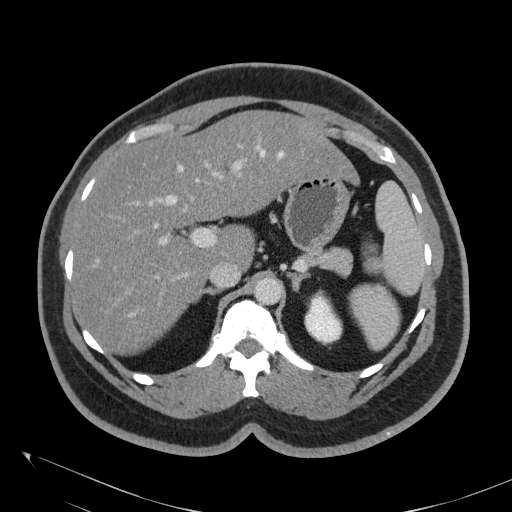
[im 72/115  lung]
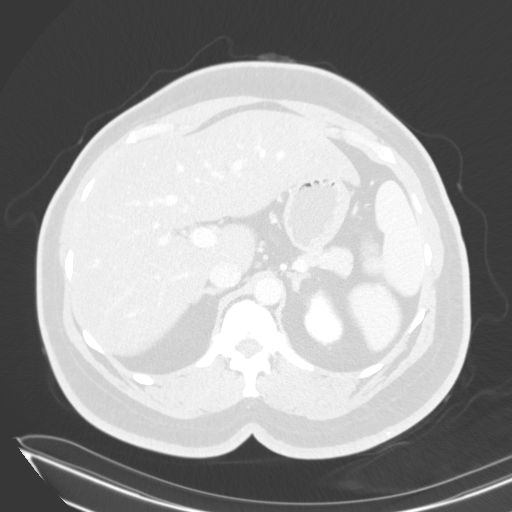
[im 86/115  soft-tissue]
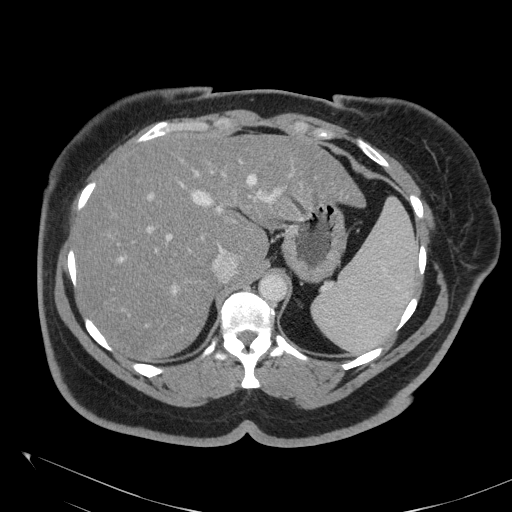
[im 86/115  lung]
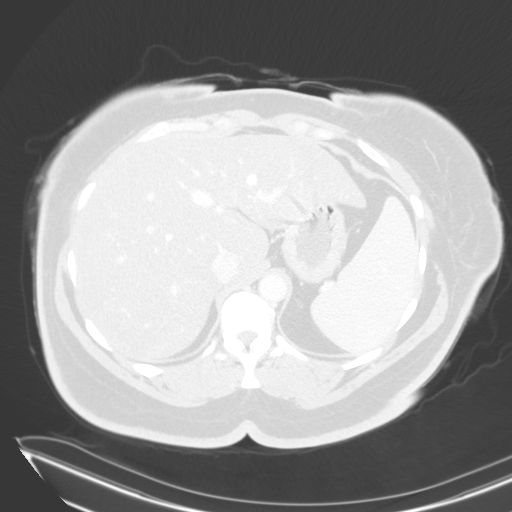
[im 100/115  soft-tissue]
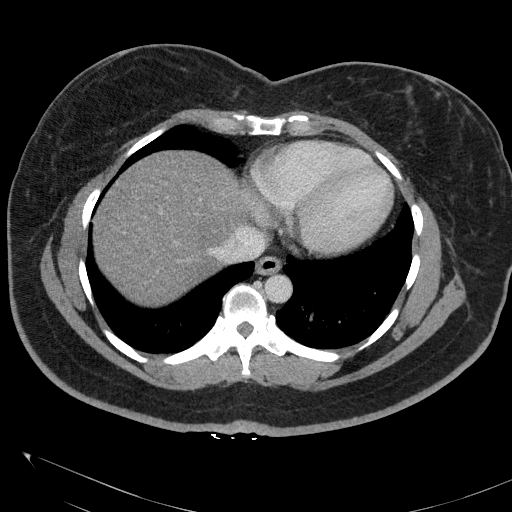
[im 100/115  lung]
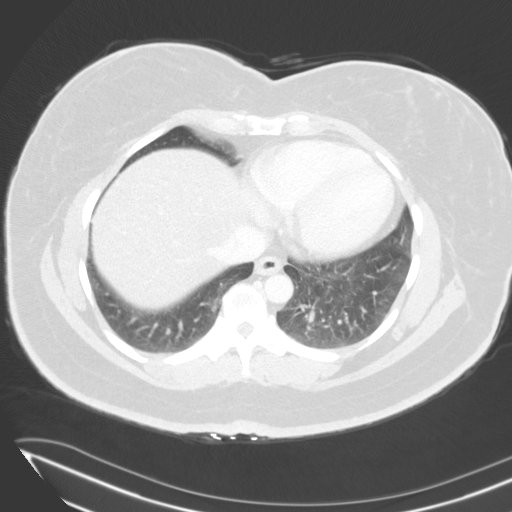

[13 of 32 positions shown; findings below may reference images not displayed]

FINDINGS: Lower chest: Incidental imaging of the lung bases without effusion
or sign of consolidative changes.

Hepatobiliary: Severe hepatic steatosis. Liver density less than 20
Hounsfield units and less than hepatic vasculature. Lobular hepatic
contours. No focal, suspicious hepatic lesion. Portal vein is
patent. Hepatic veins are patent. Fatty sparing about the
gallbladder fossa.

Pancreas: 4 mm area of low attenuation in the head/neck of the
pancreas (image 55/3) not present on previous CT imaging.

5 mm low-attenuation focus in the body of the pancreas (image [DATE])
this is stable as compared to imaging from 6011. No overt ductal
communication. No substantial ductal dilation of the main pancreatic
duct. No signs of pancreatic inflammation.

Spleen: Mildly lobular contour with cleft in the inferior spleen, no
focal lesion.

Adrenals/Urinary Tract: Tiny myelolipoma the LEFT adrenal gland
measuring 4 mm, 2 such lesions on (image 32/3). RIGHT adrenal gland
is normal.

No nephrolithiasis, hydronephrosis or perinephric stranding. Tiny
low-density lesions in the LEFT kidney or likely cysts.

Stomach/Bowel: No acute gastrointestinal findings. Appendix is
normal. Bowel is incompletely visualized as the pelvis was not
imaged.

Vascular/Lymphatic:

Aortic atherosclerosis. No sign of aneurysm. Smooth contour of the
IVC. There is no gastrohepatic or hepatoduodenal ligament
lymphadenopathy. No retroperitoneal or mesenteric lymphadenopathy.

No pelvic sidewall lymphadenopathy.

Other: No ascites.  Uterine leiomyomata are partially visualized.

Musculoskeletal: No acute bone finding. No destructive bone process.
Spinal degenerative changes. Mild spinal degenerative changes.
IMPRESSION: 4 mm area of low attenuation in the head of the pancreas not present
on previous CT imaging. 5 mm low-attenuation focus in the body of
the pancreas this is stable as compared to imaging from 6011. These
may represent small intraductal papillary mucinous neoplasms. Given
patient age and fact that the low-attenuation focus in the neck of
the pancreas is new and cannot be characterized in terms of its
absolute density, making this area indeterminate currently, suggest
MRI/MRCP for further evaluation. This will establish new baseline to
determine need for future follow-up.

Main pancreatic duct within the range of normal in terms of caliber.

Severe hepatic steatosis with lobular hepatic contours. Correlate
with any clinical or laboratory evidence of liver disease.

Tiny myelolipomas of the LEFT adrenal gland.

Aortic Atherosclerosis ([id number]).

## 2023-08-29 ENCOUNTER — Encounter: Payer: Self-pay | Admitting: Family Medicine

## 2023-09-01 ENCOUNTER — Inpatient Hospital Stay
Admission: RE | Admit: 2023-09-01 | Discharge: 2023-09-01 | Disposition: A | Discharge status: Home / Self Care | Payer: BC Managed Care – PPO | Source: Ambulatory Visit | Attending: Family Medicine | Admitting: Family Medicine

## 2023-09-01 DIAGNOSIS — K862 Cyst of pancreas: Secondary | ICD-10-CM

## 2024-03-24 ENCOUNTER — Other Ambulatory Visit: Payer: Self-pay | Admitting: Family Medicine

## 2024-03-24 DIAGNOSIS — Z1231 Encounter for screening mammogram for malignant neoplasm of breast: Secondary | ICD-10-CM

## 2024-05-04 ENCOUNTER — Ambulatory Visit
Admission: RE | Admit: 2024-05-04 | Discharge: 2024-05-04 | Disposition: A | Discharge status: Home / Self Care | Payer: Self-pay | Source: Ambulatory Visit | Attending: Family Medicine | Admitting: Family Medicine

## 2024-05-04 DIAGNOSIS — Z1231 Encounter for screening mammogram for malignant neoplasm of breast: Secondary | ICD-10-CM
